# Patient Record
Sex: Female | Born: 1975 | Race: White | Hispanic: No | Smoking: Never smoker
Health system: Southern US, Community
[De-identification: ages and names within clinical notes are randomized; demographics above are authoritative.]

## PROBLEM LIST (undated history)

## (undated) DIAGNOSIS — E282 Polycystic ovarian syndrome: Secondary | ICD-10-CM

## (undated) DIAGNOSIS — E139 Other specified diabetes mellitus without complications: Secondary | ICD-10-CM

## (undated) HISTORY — PX: CERVICAL BIOPSY  W/ LOOP ELECTRODE EXCISION: SUR135

## (undated) HISTORY — DX: Other specified diabetes mellitus without complications: E13.9

## (undated) HISTORY — DX: Polycystic ovarian syndrome: E28.2

---

## 2007-05-08 ENCOUNTER — Encounter: Admission: RE | Admit: 2007-05-08 | Discharge: 2007-05-08 | Payer: Self-pay | Admitting: Internal Medicine

## 2012-04-15 ENCOUNTER — Encounter: Payer: Self-pay | Admitting: Obstetrics & Gynecology

## 2012-05-28 ENCOUNTER — Encounter: Payer: Self-pay | Admitting: Obstetrics

## 2012-07-08 ENCOUNTER — Encounter: Payer: Self-pay | Admitting: Obstetrics & Gynecology

## 2015-06-14 ENCOUNTER — Encounter (HOSPITAL_COMMUNITY): Payer: Self-pay | Admitting: Obstetrics and Gynecology

## 2015-06-18 ENCOUNTER — Ambulatory Visit (HOSPITAL_COMMUNITY): Admission: RE | Admit: 2015-06-18 | Payer: Managed Care, Other (non HMO) | Source: Ambulatory Visit

## 2015-07-02 ENCOUNTER — Ambulatory Visit (HOSPITAL_COMMUNITY)
Admission: RE | Admit: 2015-07-02 | Discharge: 2015-07-02 | Disposition: A | Discharge status: Home / Self Care | Payer: Managed Care, Other (non HMO) | Source: Ambulatory Visit | Attending: Obstetrics and Gynecology | Admitting: Obstetrics and Gynecology

## 2015-07-02 DIAGNOSIS — Z3169 Encounter for other general counseling and advice on procreation: Secondary | ICD-10-CM | POA: Diagnosis not present

## 2015-07-02 DIAGNOSIS — E119 Type 2 diabetes mellitus without complications: Secondary | ICD-10-CM | POA: Insufficient documentation

## 2015-07-02 DIAGNOSIS — Z794 Long term (current) use of insulin: Secondary | ICD-10-CM | POA: Insufficient documentation

## 2015-07-02 NOTE — Consult Note (Signed)
Maternal Fetal Medicine Consultation  Requesting Provider(s): Fermin Schwabamer Yalcinkaya, MD  Reason for consultation: Preconception counseling, poorly controlled type 2 diabetes  HPI: Dawn Strickland is a 40 yo G1P0100 (ectopic pregnancy x 1) who was seen for preconception counseling.  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  She has undergone 3 rounds of unsuccessful IVF and has one remaining normal embryo 21(46 XY by PGS).  Dawn Strickland as diagnosed with type 2 diabetes about 4 years ago.  She is currently followed by Dr. Shawnee KnappLevy [facility] and was previously on Invokana and Metformin.  Her most recent HbA1C about 2 months ago was 9.  She has no known end-organ damage from her diabetes. Her Invokana was recently stopped and she is currently on Lantus 3-6 units each morning in addition to her Metformin.  She rarely checks her fingerstick values but reports that most of the fasting values are in the 120 range.  She has not seen her endocrinologist since Dec 2016 and has not recently had an evaluation of her eye grounds.    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  Denies family history of birth defects or hereditary disorders  Soc: Denies tobacco or ETOH use  Review of Systems: no vaginal bleeding or cramping/contractions, no LOF, no nausea/vomiting. All other systems reviewed and are negative.   PE:  164.4 lbs, 127/68, 44 (repeat HR 75, regular pulse)  A/P: 1) History of poorly controlled type 2 diabetes - Dawn Strickland recently stopped her Invokana and is currently on Metformin 1000 mg BID.  She is only rarely checking her fingerstick glucose values (maybe several times / week) but by her report all  fasting values are in the 120 mg/dl range.  Her most recent HbA1C value was about 9.  She is currently taking Lantus 3-6 units every morning "depending on what her blood sugars are".  I have increased her Lantus dose to 10 units daily and recommended that she begin checking fingerstick values 4x daily (fasting, 2hr after each meal).  She will likely need a higher dose and I have encouraged her to follow up with Dr. Shawnee KnappLevy for additional diabetic education and insulin adjustments as needed.  She is currently motivated to try to achieve better blood sugar control.  My goal is to achieve a HbA1C of approximately 6 prior to her next round of IVF with target fasting values < 100 mg/dl and post prandial values < 130 mg/dl.    Additionally, a 24 hr urine protein and creatinine clearance were ordered.  I encouraged her to be seen by Optometry / Optho to have her eye grounds evaluated.   2) Advanced Maternal Age - PGS  Showed 46XY on the remaining embryo. The patient was previously counseled regarding risks of aneuploidy and more frequent autosomal disorders associated with advanced paternal age.  We briefly discussed increased risks of hypertensive disorders of pregnancy / medical complications of pregnancy associated with advanced maternal age.  If able to successfully conceive, will need serial ultrasounds for growth and antenatal testing in the 3rd trimester with planned delivery at 39 weeks.   Thank you for the opportunity to be a part of the care of Dawn Strickland. Please contact our office if we can be of  further assistance.   I spent approximately 30 minutes with this patient with over 50% of time spent in face-to-face counseling.  Benjaman Lobe, MD Maternal Fetal Medicine

## 2015-07-02 NOTE — ED Notes (Signed)
Repeat VS prior to patient discharge was 126 81, pulse 70 and regular.

## 2015-07-02 NOTE — ED Notes (Signed)
Patient is here for preconception counseling. VS 127 68, 44 (irregular to palpation) WT: 164.4 Takes Lantus 3-6 units q am, Metformin 1000mg  BID

## 2015-07-02 NOTE — Discharge Instructions (Signed)
24-Hour Urine Collection  HOW DO I DO A 24-HOUR URINE COLLECTION?  · When you get up in the morning, urinate in the toilet and flush. Write down the time. This will be your start time on the day of collection and your end time on the next morning.  · From then on, collect all of your urine in the plastic jug that is given to you.  · Stop collecting your urine 24 hours after you started.  · If the plastic jug that is given to you already has liquid in it, that is okay. Do not throw out the liquid or rinse out the jug. Some tests need the liquid to be added to your urine.  · Keep your plastic jug cool in an ice chest or keep it in the refrigerator during the test.  · When 24 hours are over, bring your plastic jug to the clinic lab. Keep the jug cool in an ice chest while you are bringing it to the lab.     This information is not intended to replace advice given to you by your health care provider. Make sure you discuss any questions you have with your health care provider.     Document Released: 03/24/2008 Document Revised: 01/16/2014 Document Reviewed: 05/21/2013  Elsevier Interactive Patient Education ©2016 [employer]vier [employer].

## 2015-07-05 ENCOUNTER — Ambulatory Visit (HOSPITAL_COMMUNITY)
Admission: RE | Admit: 2015-07-05 | Discharge: 2015-07-05 | Disposition: A | Discharge status: Home / Self Care | Payer: Managed Care, Other (non HMO) | Source: Ambulatory Visit | Attending: Obstetrics and Gynecology | Admitting: Obstetrics and Gynecology

## 2015-07-05 DIAGNOSIS — O24119 Pre-existing diabetes mellitus, type 2, in pregnancy, unspecified trimester: Secondary | ICD-10-CM | POA: Diagnosis present

## 2015-07-05 LAB — COMPREHENSIVE METABOLIC PANEL
ALK PHOS: 87 U/L (ref 38–126)
ALT: 15 U/L (ref 14–54)
AST: 26 U/L (ref 15–41)
Albumin: 3.7 g/dL (ref 3.5–5.0)
Anion gap: 5 (ref 5–15)
BILIRUBIN TOTAL: 0.6 mg/dL (ref 0.3–1.2)
BUN: 13 mg/dL (ref 6–20)
CALCIUM: 8.9 mg/dL (ref 8.9–10.3)
CO2: 26 mmol/L (ref 22–32)
CREATININE: 0.65 mg/dL (ref 0.44–1.00)
Chloride: 105 mmol/L (ref 101–111)
Glucose, Bld: 127 mg/dL — ABNORMAL HIGH (ref 65–99)
Potassium: 4.2 mmol/L (ref 3.5–5.1)
Sodium: 136 mmol/L (ref 135–145)
Total Protein: 6.9 g/dL (ref 6.5–8.1)

## 2015-07-05 LAB — CREATININE CLEARANCE, URINE, 24 HOUR
COLLECTION INTERVAL-CRCL: 24 h
CREAT CLEAR: 165 mL/min — AB (ref 75–115)
CREATININE 24H UR: 1545 mg/d (ref 600–1800)
Creatinine, Urine: 84.64 mg/dL
Urine Total Volume-CRCL: 1825 mL

## 2015-07-05 LAB — PROTEIN, URINE, 24 HOUR
COLLECTION INTERVAL-UPROT: 24 h
Protein, 24H Urine: 110 mg/d — ABNORMAL HIGH (ref 50–100)
Protein, Urine: 6 mg/dL
URINE TOTAL VOLUME-UPROT: 1825 mL

## 2015-07-06 ENCOUNTER — Telehealth (HOSPITAL_COMMUNITY): Payer: Self-pay | Admitting: *Deleted

## 2015-07-06 NOTE — Telephone Encounter (Signed)
TC to inform pt of lab results.  Labs reviewed by Dr. Claudean SeveranceWhitecar.  Pt to follow up with her endocrinologist.  Pt voices understanding.

## 2015-07-30 ENCOUNTER — Encounter (HOSPITAL_COMMUNITY): Payer: Self-pay

## 2019-07-30 ENCOUNTER — Other Ambulatory Visit: Payer: Self-pay | Admitting: Obstetrics and Gynecology

## 2019-07-30 DIAGNOSIS — Z1231 Encounter for screening mammogram for malignant neoplasm of breast: Secondary | ICD-10-CM

## 2019-08-05 ENCOUNTER — Other Ambulatory Visit: Payer: Self-pay

## 2019-08-05 ENCOUNTER — Ambulatory Visit: Payer: Self-pay | Admitting: *Deleted

## 2019-08-05 ENCOUNTER — Ambulatory Visit
Admission: RE | Admit: 2019-08-05 | Discharge: 2019-08-05 | Disposition: A | Discharge status: Home / Self Care | Payer: No Typology Code available for payment source | Source: Ambulatory Visit | Attending: Obstetrics and Gynecology | Admitting: Obstetrics and Gynecology

## 2019-08-05 VITALS — BP 132/84 | Temp 99.1°F | Wt 158.5 lb

## 2019-08-05 DIAGNOSIS — Z01419 Encounter for gynecological examination (general) (routine) without abnormal findings: Secondary | ICD-10-CM

## 2019-08-05 DIAGNOSIS — Z1231 Encounter for screening mammogram for malignant neoplasm of breast: Secondary | ICD-10-CM

## 2019-08-05 NOTE — Patient Instructions (Addendum)
Informed Dawn Strickland about breast self awareness. Patient received a Pap smear today. Told patient about free cervical cancer screenings and let her know [facility] will cover Pap smears every 5 years unless has a history of abnormal Pap smears. Referred patient to the [facility] Center of [facility] for a screening mammogram on the mobile unit. Appointment scheduled August 05, 2019 at 1:50pm. Patient aware of appointment and will be there. Let patient know the Breast Center will follow up with her within the next couple weeks with results of her mammogram by letter or phone. Dawn Strickland verbalized understanding.  Dawn Homer, RN, FNP student 1:37 PM

## 2019-08-05 NOTE — Progress Notes (Signed)
Dawn Strickland is a 44 y.o. G2P0020 female who presents to [facility] clinic today with no complaints.    Pap Smear: Pap smear completed today. Last Pap smear was 3 years ago at [facility] and was normal. Per patient has history of an abnormal Pap smear in 2003 or 2004 which was followed up with a LEEP. She has had at least 3 normal Pap smears since then. Last Pap smear result is not available in Epic.   Physical exam: Breasts Breasts symmetrical. No skin abnormalities bilateral breasts. Nipple retraction noted in the right breast which patient states is normal for her. No nipple discharge bilateral breasts. No lymphadenopathy. No lumps palpated bilateral breasts.       Pelvic/Bimanual Ext Genitalia No lesions, no swelling and no discharge observed on external genitalia.        Vagina Vagina pink and normal texture. No lesions or discharge observed in vagina.        Cervix Cervix is present. Cervix pink and of normal texture. No discharge observed. Cervix is displaced due to uterine prolapse.   Uterus Uterus is present and palpable. Uterus is prolapsed. Patient complains of pressure when uterus palpated.        Adnexae Bilateral ovaries present and palpable. Patient complains of pressure on palpation during bimanual exam and palpation of ovaries.         Rectovaginal No rectal exam completed today since patient had no rectal complaints. No skin abnormalities observed on exam.     Smoking History: Patient has never smoked.   Patient Navigation: Patient education provided. Access to services provided for patient through [facility] program.   Breast and Cervical Cancer Risk Assessment: Patient does not have family history of breast cancer, known genetic mutations, or radiation treatment to the chest before age 70. Patient does not have history of cervical dysplasia. Patient has no history of being immunocompromised or DES exposure in-utero.  Risk Assessment    Risk Scores       08/05/2019   Last edited by: Mila Homer, RN   5-year risk: 0.9 %   Lifetime risk: 9.2 %          A: [facility] exam with pap smear No complaints today  P: Referred patient to the [facility] Center of [facility] for a screening mammogram on the mobile unit. Appointment scheduled August 05, 2019 at 1:50pm.  Mila Homer, RN, FNP student 08/05/2019 1:26 PM   Attestation of Supervision of Student:  I confirm that I have verified the information documented in the nurse practitioner student's note and that I have also personally reperformed the history, physical exam and all medical decision making activities.  I have verified that all services and findings are accurately documented in this student's note; and I agree with management and plan as outlined in the documentation. I have also made any necessary editorial changes.  No complaints of pain or tenderness on exam.  Patient referred to the Center for [facility] for pelvic pain and uterine prolapse. Patient escorted to the Center for [facility] to schedule follow-up appointment.   [place], Kathaleen Maser, RN Center for [employer], [facility] 08/05/2019 3:12 PM

## 2019-08-06 LAB — CYTOLOGY - PAP
Comment: NEGATIVE
Diagnosis: NEGATIVE
High risk HPV: NEGATIVE

## 2019-08-07 ENCOUNTER — Telehealth: Payer: Self-pay

## 2019-08-07 NOTE — Telephone Encounter (Signed)
Normal Pap/HPV letter mailed to patient.  

## 2019-08-08 ENCOUNTER — Other Ambulatory Visit: Payer: Self-pay | Admitting: Obstetrics and Gynecology

## 2019-08-08 DIAGNOSIS — R928 Other abnormal and inconclusive findings on diagnostic imaging of breast: Secondary | ICD-10-CM

## 2019-08-20 ENCOUNTER — Ambulatory Visit
Admission: RE | Admit: 2019-08-20 | Discharge: 2019-08-20 | Disposition: A | Discharge status: Home / Self Care | Payer: Managed Care, Other (non HMO) | Source: Ambulatory Visit | Attending: Obstetrics and Gynecology | Admitting: Obstetrics and Gynecology

## 2019-08-20 ENCOUNTER — Other Ambulatory Visit: Payer: Self-pay

## 2019-08-20 ENCOUNTER — Other Ambulatory Visit: Payer: Self-pay | Admitting: Obstetrics and Gynecology

## 2019-08-20 ENCOUNTER — Ambulatory Visit
Admission: RE | Admit: 2019-08-20 | Discharge: 2019-08-20 | Disposition: A | Discharge status: Home / Self Care | Payer: No Typology Code available for payment source | Source: Ambulatory Visit | Attending: Obstetrics and Gynecology | Admitting: Obstetrics and Gynecology

## 2019-08-20 DIAGNOSIS — N6320 Unspecified lump in the left breast, unspecified quadrant: Secondary | ICD-10-CM

## 2019-08-20 DIAGNOSIS — R928 Other abnormal and inconclusive findings on diagnostic imaging of breast: Secondary | ICD-10-CM

## 2019-08-22 ENCOUNTER — Ambulatory Visit
Admission: RE | Admit: 2019-08-22 | Discharge: 2019-08-22 | Disposition: A | Discharge status: Home / Self Care | Payer: No Typology Code available for payment source | Source: Ambulatory Visit | Attending: Obstetrics and Gynecology | Admitting: Obstetrics and Gynecology

## 2019-08-22 ENCOUNTER — Other Ambulatory Visit: Payer: Self-pay

## 2019-08-22 ENCOUNTER — Ambulatory Visit
Admission: RE | Admit: 2019-08-22 | Discharge: 2019-08-22 | Disposition: A | Discharge status: Home / Self Care | Payer: Managed Care, Other (non HMO) | Source: Ambulatory Visit | Attending: Obstetrics and Gynecology | Admitting: Obstetrics and Gynecology

## 2019-08-22 ENCOUNTER — Other Ambulatory Visit: Payer: Self-pay | Admitting: Obstetrics and Gynecology

## 2019-08-22 DIAGNOSIS — N6320 Unspecified lump in the left breast, unspecified quadrant: Secondary | ICD-10-CM

## 2019-08-22 DIAGNOSIS — R599 Enlarged lymph nodes, unspecified: Secondary | ICD-10-CM

## 2019-09-16 ENCOUNTER — Ambulatory Visit: Payer: Self-pay | Admitting: Surgery

## 2019-09-16 DIAGNOSIS — N6320 Unspecified lump in the left breast, unspecified quadrant: Secondary | ICD-10-CM

## 2019-09-16 NOTE — H&P (Signed)
Dawn Strickland Appointment: 09/16/2019 4:00 PM Location: [facility] Patient #: [id number] DOB: 06/25/1975 Separated / Language: [name] / Race: Refused to Report/Unreported Female  History of Present Illness Dawn Strickland A. Dawn Lemire MD; 09/16/2019 4:45 PM) Patient words: Tape patient sent at the request of Dawn Strickland agrees to abnormal left mammogram. Patient denies any history of rest pain nipple discharge or other problem with either breast. Mammogram revealed a 1 cm mass left breast upper quadrant core biopsy revealed fibroepithelial mass with mild proliferation. Atypia could not be excluded due to fragmentation. She presents to discuss left breast lumpectomy. Patient denies history of breast pain, nipple discharge and she has no family history of breast cancer.  Addenda ADDENDUM REPORT: 08/22/2019 07:26 ADDENDUM: The exam title should read: DIGITAL DIAGNOSTIC LEFT MAMMOGRAM WITH CAD AND TOMO ULTRASOUND LEFT BREAST Electronically Signed By: [name] M.D. On: 08/22/2019 07:26  Signed by [name], MD on 08/22/2019 9:26 AM Narrative & Impression CLINICAL DATA: Recall from screening mammography with tomosynthesis, possible mass involving the UPPER INNER QUADRANT of the LEFT breast at POSTERIOR depth. The patient had her initial COVID-19 vaccine in the LEFT arm less than 1 month ago. EXAM: DIGITAL DIAGNOSTIC LEFT MAMMOGRAM ULTRASOUND LEFT BREAST COMPARISON: Previous exam(s). ACR Breast Density Category b: There are scattered areas of fibroglandular density. FINDINGS: Tomosynthesis and synthesized spot-compression CC and MLO views of the area of concern in the LEFT breast were obtained. Spot compression images confirm an isodense mass involving the UPPER INNER QUADRANT at POSTERIOR depth whose MEDIAL margins are somewhat vague, though the majority the mass is circumscribed, measuring approximately 1 cm. There is no associated architectural distortion or  suspicious calcifications. Targeted LEFT breast ultrasound is performed, showing a hypoechoic solid mass with vague margins at the 11 o'clock position approximately 7 cm from nipple at POSTERIOR depth, measuring approximately 1.0 x 0.6 x 0.7 cm demonstrating posterior acoustic enhancement and no internal power Doppler flow, corresponding to the screening mammographic finding. Sonographic evaluation of the LEFT axilla demonstrates a solitary pathologic lymph node with an oval shape and loss of the fatty hilum, measuring maximally approximately 1.1 cm. IMPRESSION: 1. Indeterminate 1.0 cm mass involving the UPPER INNER QUADRANT of the LEFT breast. 2. Solitary pathologic LEFT axillary lymph node. RECOMMENDATION: Ultrasound-guided core needle biopsy of the LEFT breast mass and the LEFT axillary lymph node. The ultrasound biopsy procedure was discussed with the patient and her questions were answered. She wishes to proceed, and the biopsy has been scheduled at her convenience. I have discussed the findings and recommendations with the patient. BI-RADS CATEGORY 4: Suspicious. Electronically Signed: By: [name] M.D. On: 08/20/2019 14:40             Diagnosis 1. Breast, left, needle core biopsy, 11 o'clock - FIBROEPITHELIAL LESION WITH EPITHELIAL PROLIFERATION. 2. Lymph node, needle/core biopsy, left axilla - NODAL TISSUE, NEGATIVE FOR CARCINOMA. Microscopic Comment 1. 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 Although this finding may represent columnar cell change, atypia cannot be entirely excluded. P63, Calponin and SMM-1 demonstrate the presence of myoepithelium.  The patient is a 44 year old female.   Past Surgical History [facility] Lonni Strickland, CMA; 09/16/2019 4:01 PM) Breast Biopsy Left.  Diagnostic Studies History (Dawn Strickland, CMA; 09/16/2019 4:01 PM) Colonoscopy never Mammogram  within last year  Allergies [facility] Lonni Strickland, CMA; 09/16/2019 4:02 PM) No Known Drug Allergies [09/16/2019]: Allergies Reconciled  Medication History (Dawn Strickland, CMA; 09/16/2019 4:03 PM) Vitamin D3 (Oral) Specific  strength unknown - Active. Synjardy (12.5-1000MG  Tablet, Oral) Active. Toujeo SoloStar (Subcutaneous) Specific strength unknown - Active. Multivitamins (Oral) Specific strength unknown - Active. Medications Reconciled  Social History Dawn Strickland, CMA; 09/16/2019 4:01 PM) Alcohol use Occasional alcohol use. No caffeine use No drug use Tobacco use Never smoker.  Family History Dawn Strickland, [place]; 09/16/2019 4:01 PM) Cerebrovascular Accident Father. Diabetes Mellitus Father, Mother. Hypertension Father.  Pregnancy / Birth History Dawn Strickland, CMA; 09/16/2019 4:01 PM) Age at menarche 12 years. Gravida 2 Irregular periods Maternal age 75-40 Para 0  Other Problems (Dawn Strickland, CMA; 09/16/2019 4:01 PM) Lump In Breast Other disease, cancer, significant illness     Review of Systems (Dawn Nolan CMA; 09/16/2019 4:01 PM) General Not Present- Appetite Loss, Chills, Fatigue, Fever, Night Sweats, Weight Gain and Weight Loss. Skin Not Present- Change in Wart/Mole, Dryness, Hives, Jaundice, New Lesions, Non-Healing Wounds, Rash and Ulcer. HEENT Not Present- Earache, Hearing Loss, Hoarseness, Nose Bleed, Oral Ulcers, Ringing in the Ears, Seasonal Allergies, Sinus Pain, Sore Throat, Visual Disturbances, Wears glasses/contact lenses and Yellow Eyes. Respiratory Not Present- Bloody sputum, Chronic Cough, Difficulty Breathing, Snoring and Wheezing. Breast Present- Breast Mass. Not Present- Breast Pain, Nipple Discharge and Skin Changes. Cardiovascular Not Present- Chest Pain, Difficulty Breathing Lying Down, Leg Cramps, Palpitations, Rapid Heart Rate, Shortness of Breath and Swelling of Extremities. Gastrointestinal Not Present- Abdominal Pain, Bloating, Bloody Stool,  Change in Bowel Habits, Chronic diarrhea, Constipation, Difficulty Swallowing, Excessive gas, Gets full quickly at meals, Hemorrhoids, Indigestion, Nausea, Rectal Pain and Vomiting. Female Genitourinary Not Present- Frequency, Nocturia, Painful Urination, Pelvic Pain and Urgency. Musculoskeletal Not Present- Back Pain, Joint Pain, Joint Stiffness, Muscle Pain, Muscle Weakness and Swelling of Extremities. Neurological Not Present- Decreased Memory, Fainting, Headaches, Numbness, Seizures, Tingling, Tremor, Trouble walking and Weakness. Psychiatric Not Present- Anxiety, Bipolar, Change in Sleep Pattern, Depression, Fearful and Frequent crying. Endocrine Not Present- Cold Intolerance, Excessive Hunger, Hair Changes, Heat Intolerance, Hot flashes and New Diabetes.  Vitals (Dawn Nolan CMA; 09/16/2019 4:04 PM) 09/16/2019 4:03 PM Weight: 160.13 lb Height: 62in Body Surface Area: 1.74 m Body Mass Index: 29.29 kg/m  Temp.: 97.70F  Pulse: 105 (Regular)  BP: 126/84(Sitting, Left Arm, Standard)        Physical Exam (Emilie Carp A. Corydon Schweiss MD; 09/16/2019 4:45 PM)  General Mental Status-Alert. General Appearance-Consistent with stated age. Hydration-Well hydrated. Voice-Normal.  Head and Neck Head-normocephalic, atraumatic with no lesions or palpable masses. Trachea-midline. Thyroid Gland Characteristics - normal size and consistency.  Eye Eyeball - Bilateral-Extraocular movements intact. Sclera/Conjunctiva - Bilateral-No scleral icterus.  Chest and Lung Exam Chest and lung exam reveals -quiet, even and easy respiratory effort with no use of accessory muscles and on auscultation, normal breath sounds, no adventitious sounds and normal vocal resonance. Inspection Chest Wall - Normal. Back - normal.  Breast Breast - Left-Symmetric, Non Tender, No Biopsy scars, no Dimpling - Left, No Inflammation, No Lumpectomy scars, No Mastectomy scars, No Peau d' Orange. Breast  - Right-Symmetric, Non Tender, No Biopsy scars, no Dimpling - Right, No Inflammation, No Lumpectomy scars, No Mastectomy scars, No Peau d' Orange. Breast Lump-No Palpable Breast Mass.  Cardiovascular Cardiovascular examination reveals -normal heart sounds, regular rate and rhythm with no murmurs and normal pedal pulses bilaterally.  Abdomen Inspection Inspection of the abdomen reveals - No Hernias. Skin - Scar - no surgical scars. Palpation/Percussion Palpation and Percussion of the abdomen reveal - Soft, Non Tender, No Rebound tenderness, No Rigidity (guarding) and No hepatosplenomegaly. Auscultation Auscultation of the abdomen reveals -  Bowel sounds normal.  Neurologic Neurologic evaluation reveals -alert and oriented x 3 with no impairment of recent or remote memory. Mental Status-Normal.  Musculoskeletal Normal Exam - Left-Upper Extremity Strength Normal and Lower Extremity Strength Normal. Normal Exam - Right-Upper Extremity Strength Normal and Lower Extremity Strength Normal.  Lymphatic Head & Neck  General Head & Neck Lymphatics: Bilateral - Description - Normal. Axillary  General Axillary Region: Bilateral - Description - Normal. Tenderness - Non Tender. Femoral & Inguinal  Generalized Femoral & Inguinal Lymphatics: Bilateral - Description - Normal. Tenderness - Non Tender.    Assessment & Plan (Deeya Richeson A. Milina Pagett MD; 09/16/2019 4:46 PM)  LEFT BREAST MASS (N63.20) Impression: Fibroepithelial lesion. With fragmentation and possible atypia. Difficult to discern due to fragmentation. Recommend left breast lumpectomy further definitive diagnosis. Risks of bleeding, infection, cosmetic deformity, need for further surgery and/or treatments depending on final findings. Risk of lumpectomy include bleeding, infection, seroma, more surgery, use of seed/wire, wound care, cosmetic deformity and the need for other treatments, death , blood clots, death. Pt agrees to  proceed.   MASS OF LEFT BREAST ON MAMMOGRAM (N63.20)  Current Plans Pt Education - [facility] Free Text Education/Instructions: discussed with patient and provided information. Pt Education - [facility] Breast Biopsy HCI: discussed with patient and provided information.

## 2019-09-22 ENCOUNTER — Encounter: Payer: Self-pay | Admitting: Obstetrics and Gynecology

## 2019-09-22 ENCOUNTER — Ambulatory Visit (INDEPENDENT_AMBULATORY_CARE_PROVIDER_SITE_OTHER): Payer: Self-pay | Admitting: Obstetrics and Gynecology

## 2019-09-22 ENCOUNTER — Other Ambulatory Visit: Payer: Self-pay

## 2019-09-22 DIAGNOSIS — N814 Uterovaginal prolapse, unspecified: Secondary | ICD-10-CM | POA: Insufficient documentation

## 2019-09-22 DIAGNOSIS — N926 Irregular menstruation, unspecified: Secondary | ICD-10-CM | POA: Insufficient documentation

## 2019-09-22 NOTE — Progress Notes (Signed)
Dawn Strickland presents with c/o heavy irregular cycles and vaginal bulge. H/O PCOS. Has used OCP's over ten years ago. LMP 09/18/19. Prior to that LMP was in March. Last pap 7/21.  Mammogram 7/21, abnormal, fibroadenoma, for surgerical removal this month. Sexual active SAB x 2 Denies any bowel or bladder dysfunction  H/O DM, followed by IM. Last A1c 7.8 in July  PE AF VSS Lungs clear Heart RRR Abd soft + BS GU nl EGBUS good ant and posterior support, complete uterine prolapse in supine position with valsalva, cervix no lesion, uterus small, mobile, no adnexal masses, uterosacral ligaments good support, perineal body @ 2 cm, no evidence of cystocele or rectocele  A/P Irregular cycles        Uterine prolapse        H/O PCOS  Suspect pt's cycle problems are hormonally, age and PCOS related. Normal TSH 6 months ago. Will check GYN U/S. Encouraged better DM control. Prolapse reviewed with pt. Will discuss Tx options in more detail with follow up visit after U/S.

## 2019-09-30 ENCOUNTER — Ambulatory Visit
Admission: RE | Admit: 2019-09-30 | Discharge: 2019-09-30 | Disposition: A | Discharge status: Home / Self Care | Payer: No Typology Code available for payment source | Source: Ambulatory Visit | Attending: Obstetrics and Gynecology | Admitting: Obstetrics and Gynecology

## 2019-09-30 ENCOUNTER — Other Ambulatory Visit: Payer: Self-pay

## 2019-09-30 DIAGNOSIS — N926 Irregular menstruation, unspecified: Secondary | ICD-10-CM | POA: Insufficient documentation

## 2019-10-02 ENCOUNTER — Other Ambulatory Visit: Payer: Self-pay | Admitting: Surgery

## 2019-10-02 DIAGNOSIS — N6320 Unspecified lump in the left breast, unspecified quadrant: Secondary | ICD-10-CM

## 2019-10-30 ENCOUNTER — Other Ambulatory Visit: Payer: Self-pay

## 2019-10-30 ENCOUNTER — Encounter: Payer: Self-pay | Admitting: Obstetrics and Gynecology

## 2019-10-30 ENCOUNTER — Ambulatory Visit (INDEPENDENT_AMBULATORY_CARE_PROVIDER_SITE_OTHER): Payer: Self-pay | Admitting: Obstetrics and Gynecology

## 2019-10-30 VITALS — BP 138/84 | HR 83 | Ht 62.0 in | Wt 164.2 lb

## 2019-10-30 DIAGNOSIS — N926 Irregular menstruation, unspecified: Secondary | ICD-10-CM

## 2019-10-30 DIAGNOSIS — N814 Uterovaginal prolapse, unspecified: Secondary | ICD-10-CM

## 2019-10-30 MED ORDER — MEGESTROL ACETATE 20 MG PO TABS: 40.0000 mg | ORAL_TABLET | Freq: Every day | ORAL | 5 refills | Status: AC

## 2019-10-30 NOTE — Patient Instructions (Signed)
Vaginal Hysterectomy, Care After Refer to this sheet in the next few weeks. These instructions provide you with information about caring for yourself after your procedure. Your health care provider may also give you more specific instructions. Your treatment has been planned according to current medical practices, but problems sometimes occur. Call your health care provider if you have any problems or questions after your procedure. What can I expect after the procedure? After the procedure, it is common to have:  Pain.  Soreness and numbness in your incision areas.  Vaginal bleeding and discharge.  Constipation.  Temporary problems emptying the bladder.  Feelings of sadness or other emotions. Follow these instructions at home: Medicines  Take over-the-counter and prescription medicines only as told by your health care provider.  If you were prescribed an antibiotic medicine, take it as told by your health care provider. Do not stop taking the antibiotic even if you start to feel better.  Do not drive or operate heavy machinery while taking prescription pain medicine. Activity  Return to your normal activities as told by your health care provider. Ask your health care provider what activities are safe for you.  Get regular exercise as told by your health care provider. You may be told to take short walks every day and go farther each time.  Do not lift anything that is heavier than 10 lb (4.5 kg). General instructions   Do not put anything in your vagina for 6 weeks after your surgery or as told by your health care provider. This includes tampons and douches.  Do not have sex until your health care provider says you can.  Do not take baths, swim, or use a hot tub until your health care provider approves.  Drink enough fluid to keep your urine clear or pale yellow.  Do not drive for 24 hours if you were given a sedative.  Keep all follow-up visits as told by your health  care provider. This is important. Contact a health care provider if:  Your pain medicine is not helping.  You have a fever.  You have redness, swelling, or pain at your incision site.  You have blood, pus, or a bad-smelling discharge from your vagina.  You continue to have difficulty urinating. Get help right away if:  You have severe abdominal or back pain.  You have heavy bleeding from your vagina.  You have chest pain or shortness of breath. This information is not intended to replace advice given to you by your health care provider. Make sure you discuss any questions you have with your health care provider. Document Revised: 08/19/2015 Document Reviewed: 01/10/2015 Elsevier Patient Education  2020 [employer] Vaginal Hysterectomy  A vaginal hysterectomy is a procedure to remove all or part of the uterus through a small incision in the vagina. In this procedure, your health care provider may remove your entire uterus, including the lower end (cervix). You may need a vaginal hysterectomy to treat:  Uterine fibroids.  A condition that causes the lining of the uterus to grow in other areas (endometriosis).  Problems with pelvic support.  Cancer of the cervix, ovaries, uterus, or tissue that lines the uterus (endometrium).  Excessive (dysfunctional) uterine bleeding. When removing your uterus, your health care provider may also remove the organs that produce eggs (ovaries) and the tubes that carry eggs to your uterus (fallopian tubes). After a vaginal hysterectomy, you will no longer be able to have a baby. You will also no longer get your   menstrual period. Tell a health care provider about:  Any allergies you have.  All medicines you are taking, including vitamins, herbs, eye drops, creams, and over-the-counter medicines.  Any problems you or family members have had with anesthetic medicines.  Any blood disorders you have.  Any surgeries you have had.  Any medical  conditions you have.  Whether you are pregnant or may be pregnant. What are the risks? Generally, this is a safe procedure. However, problems may occur, including:  Bleeding.  Infection.  A blood clot that forms in your leg and travels to your lungs (pulmonary embolism).  Damage to surrounding organs.  Pain during sex. What happens before the procedure?  Ask your health care provider what organs will be removed during surgery.  Ask your health care provider about: ? Changing or stopping your regular medicines. This is especially important if you are taking diabetes medicines or blood thinners. ? Taking medicines such as aspirin and ibuprofen. These medicines can thin your blood. Do not take these medicines before your procedure if your health care provider instructs you not to.  Follow instructions from your health care provider about eating or drinking restrictions.  Do not use any tobacco products, such as cigarettes, chewing tobacco, and e-cigarettes. If you need help quitting, ask your health care provider.  Plan to have someone take you home after discharge from the hospital. What happens during the procedure?  To reduce your risk of infection: ? Your health care team will wash or sanitize their hands. ? Your skin will be washed with soap.  An IV tube will be inserted into one of your veins.  You may be given antibiotic medicine to help prevent infection.  You will be given one or more of the following: ? A medicine to help you relax (sedative). ? A medicine to numb the area (local anesthetic). ? A medicine to make you fall asleep (general anesthetic). ? A medicine that is injected into an area of your body to numb everything beyond the injection site (regional anesthetic).  Your surgeon will make an incision in your vagina.  Your surgeon will locate and remove all or part of your uterus.  Your ovaries and fallopian tubes may be removed at the same time.  The  incision will be closed with stitches (sutures) that dissolve over time. The procedure may vary among health care providers and hospitals. What happens after the procedure?  Your blood pressure, heart rate, breathing rate, and blood oxygen level will be monitored often until the medicines you were given have worn off.  You will be encouraged to get up and walk around after a few hours to help prevent complications.  You may have IV tubes in place for a few days.  You will be given pain medicine as needed.  Do not drive for 24 hours if you were given a sedative. This information is not intended to replace advice given to you by your health care provider. Make sure you discuss any questions you have with your health care provider. Document Revised: 08/19/2015 Document Reviewed: 01/10/2015 Elsevier Patient Education  2020 Elsevier Inc.  

## 2019-11-17 ENCOUNTER — Other Ambulatory Visit (HOSPITAL_COMMUNITY): Payer: No Typology Code available for payment source

## 2019-11-20 ENCOUNTER — Ambulatory Visit (HOSPITAL_BASED_OUTPATIENT_CLINIC_OR_DEPARTMENT_OTHER): Admit: 2019-11-20 | Payer: No Typology Code available for payment source | Admitting: Surgery

## 2019-11-20 ENCOUNTER — Encounter (HOSPITAL_BASED_OUTPATIENT_CLINIC_OR_DEPARTMENT_OTHER): Payer: Self-pay

## 2019-11-20 SURGERY — BREAST LUMPECTOMY WITH RADIOACTIVE SEED LOCALIZATION
Anesthesia: General | Site: Breast | Laterality: Left

## 2021-01-19 ENCOUNTER — Other Ambulatory Visit: Payer: Self-pay | Admitting: Internal Medicine

## 2021-01-19 ENCOUNTER — Other Ambulatory Visit: Payer: Self-pay

## 2021-01-19 ENCOUNTER — Ambulatory Visit
Admission: RE | Admit: 2021-01-19 | Discharge: 2021-01-19 | Disposition: A | Discharge status: Home / Self Care | Payer: Self-pay | Source: Ambulatory Visit | Attending: Internal Medicine | Admitting: Internal Medicine

## 2021-01-19 DIAGNOSIS — R079 Chest pain, unspecified: Secondary | ICD-10-CM

## 2021-06-24 IMAGING — MG MM BREAST LOCALIZATION CLIP
8 series · 8 of 24 positions shown · non-contrast
Comparison: Previous exam(s).

CLINICAL DATA: Status post ultrasound-guided biopsy of a LEFT
breast mass at the 11 o'clock axis and a single morphologically
abnormal lymph node in the LEFT axilla.

EXAM:
DIAGNOSTIC LEFT MAMMOGRAM POST ULTRASOUND BIOPSY x2

[L MLO synth-2D (1 of 2)]
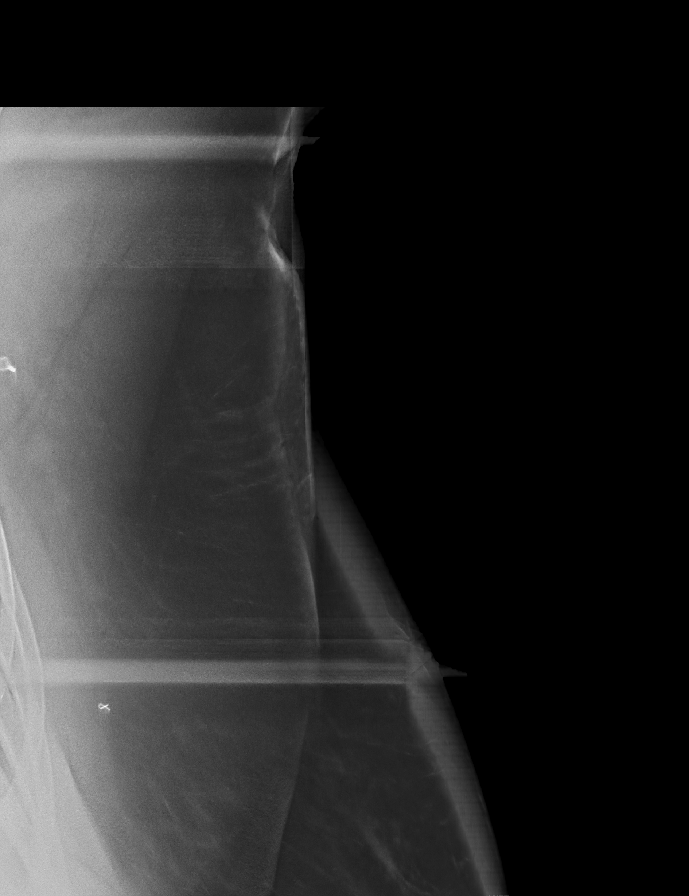

[L MLO synth-2D (2 of 2)]
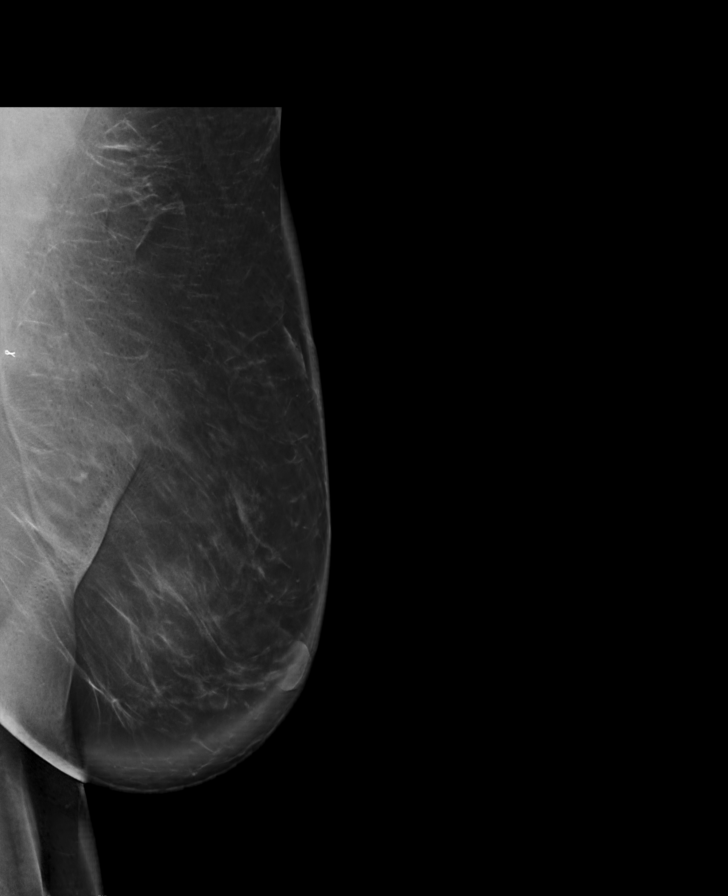

[L CC synth-2D]
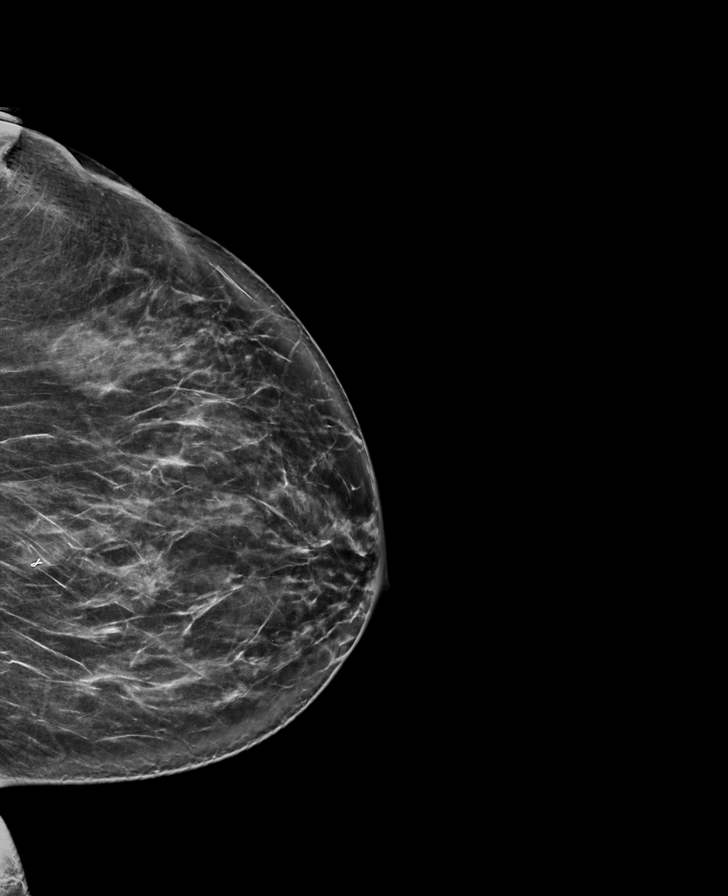

[L ML synth-2D]
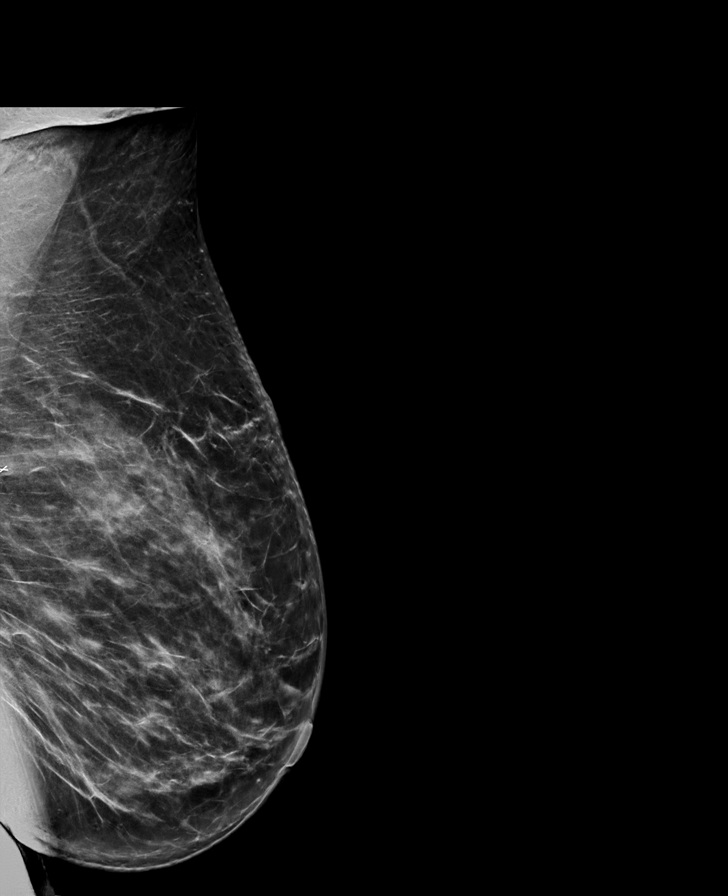

[L MLO tomo (1 of 2) · tomo slice 65/129.0]
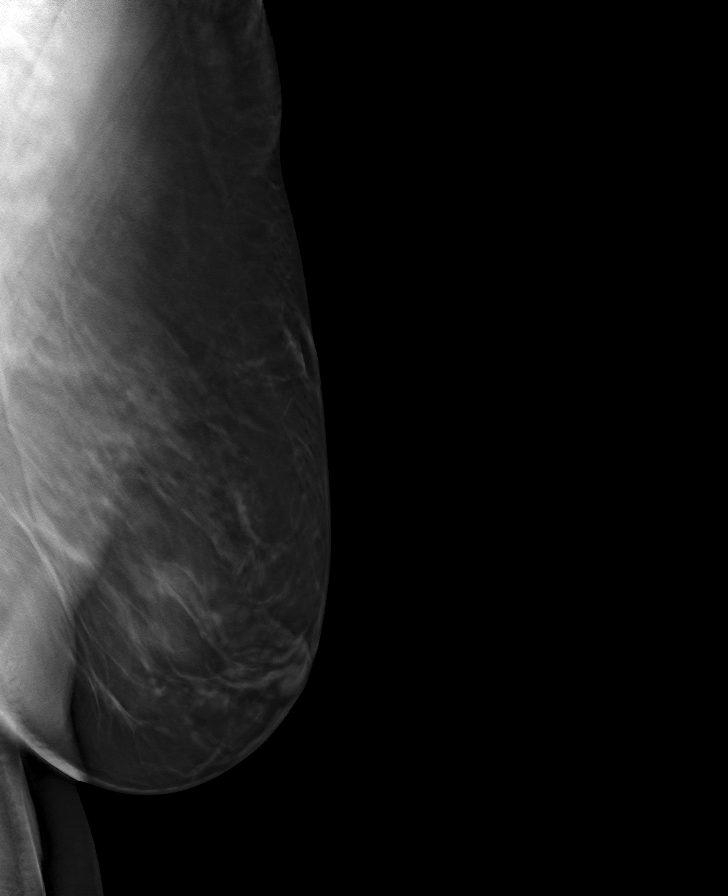

[L MLO tomo (2 of 2) · tomo slice 92/183.0]
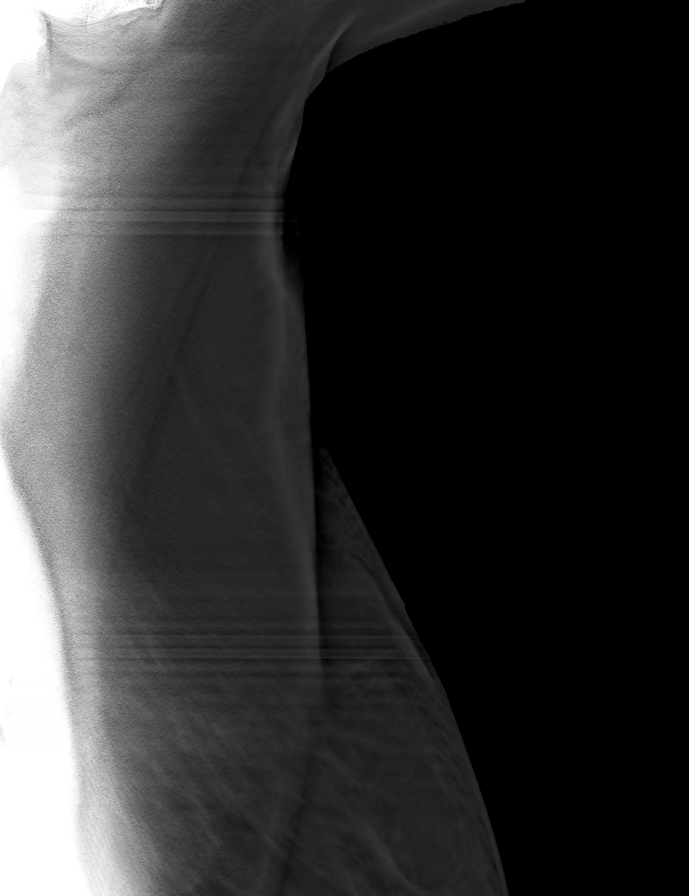

[L CC tomo · tomo slice 39/78.0]
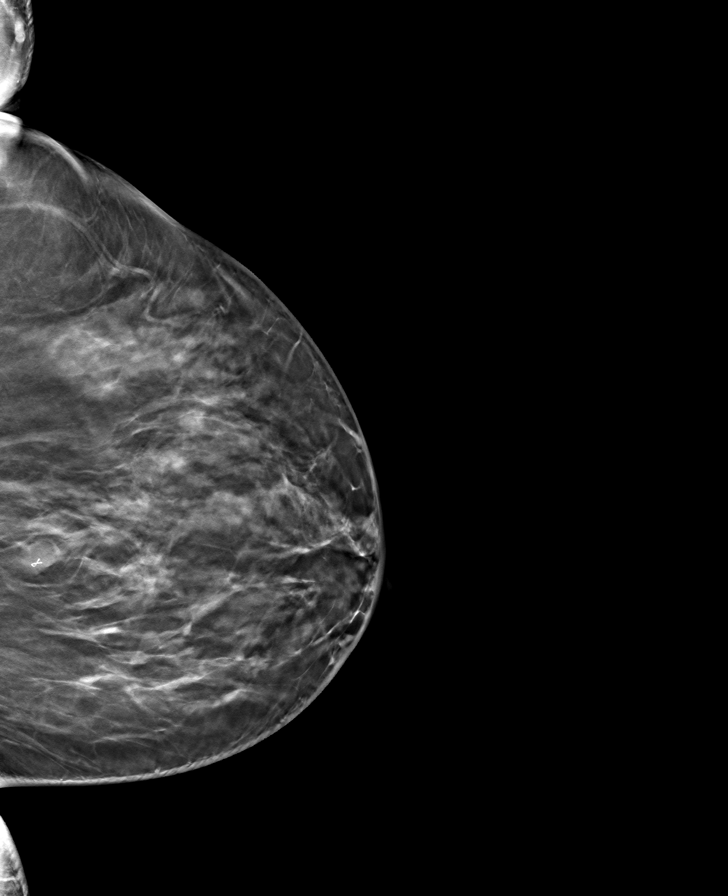

[L ML tomo · tomo slice 41/80.0]
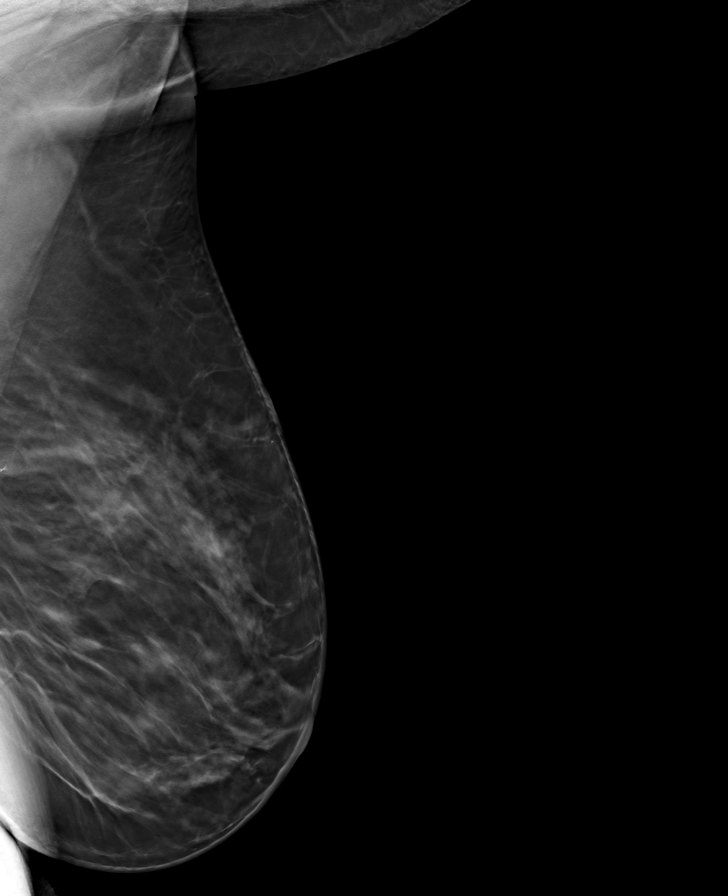

[8 of 24 positions shown; findings below may reference images not displayed]

FINDINGS: Mammographic images were obtained following ultrasound guided biopsy
of the LEFT breast mass at the 11 o'clock axis followed by
ultrasound guided biopsy of a single morphologically abnormal lymph
node in the LEFT axilla. The biopsy marking clips are in expected
position at the sites of biopsy.
IMPRESSION: 1. Appropriate positioning of the ribbon shaped biopsy marking clip
at the site of biopsy in the LEFT breast at the 11 o'clock axis.
2. Appropriate positioning of the Q shaped biopsy marking clip at
the site of biopsy in the LEFT axilla.

Final Assessment: Post Procedure Mammograms for Marker Placement

## 2023-10-01 ENCOUNTER — Ambulatory Visit

## 2023-11-26 ENCOUNTER — Ambulatory Visit (INDEPENDENT_AMBULATORY_CARE_PROVIDER_SITE_OTHER)

## 2023-11-26 VITALS — BP 124/74 | HR 78 | Temp 98.0°F | Ht 60.0 in | Wt 153.2 lb

## 2023-11-26 DIAGNOSIS — Z72821 Inadequate sleep hygiene: Secondary | ICD-10-CM | POA: Diagnosis not present

## 2023-11-26 DIAGNOSIS — G4721 Circadian rhythm sleep disorder, delayed sleep phase type: Secondary | ICD-10-CM | POA: Diagnosis not present

## 2023-11-26 DIAGNOSIS — G4733 Obstructive sleep apnea (adult) (pediatric): Secondary | ICD-10-CM

## 2023-11-26 DIAGNOSIS — R0683 Snoring: Secondary | ICD-10-CM

## 2023-11-26 NOTE — Addendum Note (Signed)
 Addended by: Hallie Ishida M on: 11/26/2023 01:34 PM   Modules accepted: Orders

## 2023-11-26 NOTE — Progress Notes (Signed)
 Pulmonology Office Visit   Subjective:  Patient ID: Dawn Strickland, female    DOB: 11-09-1975  MRN: [id number]  Referred by: Valma Carwin, MD  CC:  Chief Complaint  Patient presents with   Consult    Snoring, trouble falling asleep and staying asleep.  Fatigue during the day.    HPI  Dawn Strickland is a 48 y.o. female non smoker, w DM 2, PCOS, PVC comes for evaluation of OSA.  Discussed the use of AI scribe software for clinical note transcription with the patient, who gave verbal consent to proceed.  History of Present Illness   Dawn Strickland is a 48 year old female who presents with obstructive sleep apnea. She was referred by her primary care provider for evaluation of sleep apnea.  She experiences significant sleep disturbances, including difficulty falling asleep, taking approximately two hours to do so. She describes herself as a 'light sleeper,' easily awakened by slight noises, and wakes up multiple times during the night, approximately four times. It takes her over thirty minutes to fall back asleep after waking. She wakes up feeling tired and experiences dry mouth in the morning.  No snoring, morning headaches, restless legs, nightmares, or night terrors. Her sleep schedule varies, typically going to bed between 10 and 11 PM, and waking at 6 AM on workdays, or around 9:30 to 10 AM on days off. She feels groggy upon waking early and often takes naps in the afternoon when off work, particularly after meals.  She has a history of working night shifts, which she stopped three years ago, but identifies as a 'night owl,' preferring to stay up late. She experiences anxiety, particularly related to work stress, which she believes contributes to her sleep issues. She occasionally consumes alcohol, primarily wine, and does not smoke.  In terms of her sleep environment, she often uses a computer before bed, which may contribute to her difficulty falling asleep. She does not work in  bed and has no family history of sleep apnea.          11/26/2023    1:00 PM  Results of the Epworth flowsheet  Sitting and reading 2  Watching TV 2  Sitting, inactive in a public place (e.g. a theatre or a meeting) 2  As a passenger in a car for an hour without a break 2  Lying down to rest in the afternoon when circumstances permit 2  Sitting and talking to someone 0  Sitting quietly after a lunch without alcohol 2  In a car, while stopped for a few minutes in traffic 0  Total score 12    Allergies: Patient has no known allergies.  Current Outpatient Medications:    cholecalciferol (VITAMIN D3) 25 MCG (1000 UNIT) tablet, Take 1,000 Units by mouth daily., Disp: , Rfl:    co-enzyme Q-10 30 MG capsule, Take 30 mg by mouth 3 (three) times daily., Disp: , Rfl:    Empagliflozin-metFORMIN HCl (SYNJARDY) 12.05-998 MG TABS, Take by mouth., Disp: , Rfl:    insulin glargine, 2 Unit Dial, (TOUJEO MAX SOLOSTAR) 300 UNIT/ML Solostar Pen, Inject into the skin., Disp: , Rfl:    insulin glargine, 2 Unit Dial, (TOUJEO MAX SOLOSTAR) 300 UNIT/ML Solostar Pen, Inject into the skin., Disp: , Rfl:    megestrol  (MEGACE ) 20 MG tablet, Take 2 tablets (40 mg total) by mouth daily. Can increase to two tablets twice a day in the event of heavy bleeding, Disp: 30 tablet, Rfl: 5   MOUNJARO 5  MG/0.5ML Pen, Inject 5 mg into the skin once a week. (Patient taking differently: Inject 7.5 mg into the skin once a week.), Disp: , Rfl:    Multiple Vitamin (MULTIVITAMIN WITH MINERALS) TABS tablet, Take 1 tablet by mouth daily., Disp: , Rfl:    Semaglutide,0.25 or 0.5MG /DOS, (OZEMPIC, 0.25 OR 0.5 MG/DOSE,) 2 MG/1.5ML SOPN, Inject into the skin., Disp: , Rfl:    Turmeric (QC TUMERIC COMPLEX) 500 MG CAPS, Take by mouth., Disp: , Rfl:    vitamin E 180 MG (400 UNITS) capsule, Take 400 Units by mouth daily., Disp: , Rfl:  Past Medical History:  Diagnosis Date   Diabetes 1.5, managed as type 2 (HCC)    PCOS (polycystic  ovarian syndrome)    Past Surgical History:  Procedure Laterality Date   CERVICAL BIOPSY  W/ LOOP ELECTRODE EXCISION  2003 or 2004   Family History  Problem Relation Age of Onset   Diabetes Mother    Hypertension Mother    Stroke Mother    Diabetes Father    Stroke Father    Hypertension Father    Social History   Socioeconomic History   Marital status: Unknown    Spouse name: Not on file   Number of children: Not on file   Years of education: Not on file   Highest education level: Master's degree (e.g., MA, MS, MEng, MEd, MSW, MBA)  Occupational History   Not on file  Tobacco Use   Smoking status: Never   Smokeless tobacco: Never  Vaping Use   Vaping status: Never Used  Substance and Sexual Activity   Alcohol use: Yes    Comment: occ   Drug use: Never   Sexual activity: Yes    Birth control/protection: None  Other Topics Concern   Not on file  Social History Narrative   Not on file   Social Drivers of Health   Financial Resource Strain: Not on file  Food Insecurity: Not on file  Transportation Needs: No Transportation Needs (08/05/2019)   PRAPARE - Administrator, Civil Service (Medical): No    Lack of Transportation (Non-Medical): No  Physical Activity: Not on file  Stress: Not on file  Social Connections: Unknown (12/27/2021)   Received from [facility]   Social Network    Social Network: Not on file  Intimate Partner Violence: Unknown (12/27/2021)   Received from [facility]   HITS    Physically Hurt: Not on file    Insult or Talk Down To: Not on file    Threaten Physical Harm: Not on file    Scream or Curse: Not on file       Objective:  BP 124/74   Pulse 78   Temp 98 F (36.7 C) (Oral)   Ht 5' (1.524 m)   Wt 153 lb 3.2 oz (69.5 kg)   SpO2 100% Comment: room air  BMI 29.92 kg/m  BMI Readings from Last 3 Encounters:  11/26/23 29.92 kg/m  10/30/19 30.03 kg/m  09/22/19 29.19 kg/m    Physical Exam: Physical Exam    ENT: Normal mucosa. No hypertrophy of inferior turbinates. Tonsils are normal sized. Modified Mallampati score of 2. PULMONARY: Lungs clear to auscultation bilaterally, no adventitious breath sounds. CARDIOVASCULAR: Regular rate and rhythm, S1 S2 normal, no murmurs. ABDOMEN: Abdomen soft, nontender. Bowel sounds are normal. EXTREMITIES: No peripheral edema noted.       Diagnostic Review:  Last metabolic panel Lab Results  Component Value Date   GLUCOSE  127 (H) 07/05/2015   NA 136 07/05/2015   K 4.2 07/05/2015   CL 105 07/05/2015   CO2 26 07/05/2015   BUN 13 07/05/2015   CREATININE 0.65 07/05/2015   GFRNONAA >60 07/05/2015   CALCIUM 8.9 07/05/2015   PROT 6.9 07/05/2015   ALBUMIN 3.7 07/05/2015   BILITOT 0.6 07/05/2015   ALKPHOS 87 07/05/2015   AST 26 07/05/2015   ALT 15 07/05/2015   ANIONGAP 5 07/05/2015         Assessment & Plan:  Assessment and Plan    Suspected obstructive sleep apnea Symptoms suggestive of sleep apnea, but insomnia likely exacerbated by poor sleep hygiene. I discussed with the patient the pathophysiology of obstructive sleep apnea, its association with weight, and its negative effects on hypertension, diabetes, mental health, A-fib, stroke if left untreated.  I briefly discussed the treatment options for obstructive sleep apnea  - Ordered home sleep study to confirm diagnosis. If neg will get NPSG.  - If positive, initiate CPAP therapy.  Insomnia Delayed sleep phase Likely due to poor sleep hygiene, including delayed sleep phase and stress. - Advised to avoid electronic devices at least one hour before bedtime. - Recommended establishing a bedtime routine. - will initiate melatonin for DSPS after r/o OSA.   Anxiety symptoms Related to work stress, contributing to insomnia.      She was counselled about not driving while drowsy which is common side effect of sleep related disorders.   Return for 2 months after sleep study.   I personally  spent a total of 25 minutes in the care of the patient today including preparing to see the patient, getting/reviewing separately obtained history, performing a medically appropriate exam/evaluation, counseling and educating, placing orders, documenting clinical information in the EHR, independently interpreting results, and communicating results.   Graclynn Vanantwerp, MD

## 2023-11-26 NOTE — Patient Instructions (Signed)
  VISIT SUMMARY: Today, you were seen for your sleep disturbances and potential sleep apnea. You have been experiencing difficulty falling asleep, waking up multiple times during the night, and feeling tired in the morning. We discussed your sleep habits, work stress, and lifestyle factors that may be contributing to your sleep issues.  YOUR PLAN: -SUSPECTED OBSTRUCTIVE SLEEP APNEA: Obstructive sleep apnea is a condition where your breathing repeatedly stops and starts during sleep. We have ordered a home sleep study to confirm the diagnosis. If the results are positive, we will start you on CPAP therapy, which helps keep your airways open while you sleep.  -INSOMNIA: Insomnia is difficulty falling or staying asleep. Your insomnia may be due to  sleep habits and stress. We recommend avoiding electronic devices at least one hour before bedtime and establishing a consistent bedtime routine to improve your sleep quality.  -ANXIETY SYMPTOMS: Anxiety can cause excessive worry and stress, which can interfere with your sleep. Your work-related stress may be contributing to your insomnia. Addressing your anxiety through stress management techniques may help improve your sleep.  INSTRUCTIONS: Please complete the home sleep study as ordered. Follow the recommendations for improving your sleep hygiene, such as avoiding electronic devices before bed and establishing a bedtime routine. If the home sleep study confirms sleep apnea, we will discuss starting CPAP therapy. Additionally, consider stress management techniques to help with your anxiety and improve your sleep.                      Contains text generated by Abridge.                                 Contains text generated by Abridge.

## 2023-12-21 ENCOUNTER — Encounter

## 2023-12-21 DIAGNOSIS — R0683 Snoring: Secondary | ICD-10-CM

## 2024-01-06 ENCOUNTER — Telehealth: Payer: Self-pay

## 2024-01-06 DIAGNOSIS — G4733 Obstructive sleep apnea (adult) (pediatric): Secondary | ICD-10-CM

## 2024-01-06 DIAGNOSIS — G4730 Sleep apnea, unspecified: Secondary | ICD-10-CM | POA: Diagnosis not present

## 2024-01-06 NOTE — Telephone Encounter (Signed)
 Date of Study: 12/21/23  Interpretation: Mild OSA with AHI of 2.4 (11.1 AHI3%), O2 desaturation <1 min. O2 nadir 87%.   Plan: Initiate auto CPAP at 6-15 cm H2O and provide supplies.  Some insurance will not approve CPAP as her AHI by 4% criteria was less than 5.  If that happens with her insurance we will have to bring her to back for in-lab sleep study. Side sleep.   Sammi Fredericks, MD.

## 2024-01-07 NOTE — Telephone Encounter (Signed)
 Copied from CRM #[id number]. Topic: Clinical - Lab/Test Results >> Jan 07, 2024  1:08 PM Isabell A wrote: Reason for CRM: Patient returning phone from [place] for her sleep study results.   Callback number: [phone]   Called and spoke with the pt and advised of Dr. Theodoro note. Pt verbalized understanding and would like order placed to start on CPAP.  Order has been placed.

## 2024-01-07 NOTE — Telephone Encounter (Signed)
ATC x1.  LMTCB. 

## 2024-02-11 ENCOUNTER — Ambulatory Visit
